# Patient Record
Sex: Male | Born: 1990 | Race: Black or African American | Hispanic: No | Marital: Single | Smoking: Never smoker
Health system: Southern US, Community
[De-identification: ages and names within clinical notes are randomized; demographics above are authoritative.]

## PROBLEM LIST (undated history)

## (undated) DIAGNOSIS — F909 Attention-deficit hyperactivity disorder, unspecified type: Secondary | ICD-10-CM

## (undated) HISTORY — PX: TONSILLECTOMY AND ADENOIDECTOMY: SUR1326

## (undated) HISTORY — DX: Attention-deficit hyperactivity disorder, unspecified type: F90.9

## (undated) HISTORY — PX: OTHER SURGICAL HISTORY: SHX169

---

## 2013-12-31 ENCOUNTER — Ambulatory Visit: Payer: BC Managed Care – PPO

## 2013-12-31 ENCOUNTER — Ambulatory Visit (INDEPENDENT_AMBULATORY_CARE_PROVIDER_SITE_OTHER): Payer: BC Managed Care – PPO | Admitting: Family Medicine

## 2013-12-31 VITALS — BP 100/68 | HR 85 | Temp 98.3°F | Resp 16 | Ht 66.0 in | Wt 165.0 lb

## 2013-12-31 DIAGNOSIS — M25571 Pain in right ankle and joints of right foot: Secondary | ICD-10-CM

## 2013-12-31 DIAGNOSIS — T148XXA Other injury of unspecified body region, initial encounter: Secondary | ICD-10-CM

## 2013-12-31 DIAGNOSIS — M79673 Pain in unspecified foot: Secondary | ICD-10-CM

## 2013-12-31 DIAGNOSIS — M79609 Pain in unspecified limb: Secondary | ICD-10-CM

## 2013-12-31 DIAGNOSIS — M25579 Pain in unspecified ankle and joints of unspecified foot: Secondary | ICD-10-CM

## 2013-12-31 MED ORDER — HYDROCODONE-ACETAMINOPHEN 5-325 MG PO TABS
1.0000 | ORAL_TABLET | Freq: Four times a day (QID) | ORAL | Status: AC | PRN
Start: 2013-12-31 — End: ?

## 2013-12-31 NOTE — Progress Notes (Signed)
Chief Complaint:  Chief Complaint  Patient presents with  . Ankle Injury    right ankle, twisted during baseball game    HPI: Jermaine Dalton is a 23 y.o. male who is here for  Right heel pain and some ankle pain started around 2 pm this afternoon after colliding with another player during baseball, It is a 1/0/10 sharp pain , he has pain constant throbbing 10/10 painven  ewhen something touches it . Has tried ice ad nonweightbearing  Past Medical History  Diagnosis Date  . ADHD (attention deficit hyperactivity disorder)    Past Surgical History  Procedure Laterality Date  . Cheek bone surgery      age 89 during baseball   History   Social History  . Marital Status: Single    Spouse Name: N/A    Number of Children: N/A  . Years of Education: N/A   Social History Main Topics  . Smoking status: Never Smoker   . Smokeless tobacco: Current User    Types: Chew  . Alcohol Use: None  . Drug Use: None  . Sexual Activity: None   Other Topics Concern  . None   Social History Narrative  . None   No family history on file. No Known Allergies Prior to Admission medications   Medication Sig Start Date End Date Taking? Authorizing Provider  amphetamine-dextroamphetamine (ADDERALL XR) 20 MG 24 hr capsule Take 20 mg by mouth daily.   Yes Historical Provider, MD     ROS: The patient denies fevers, chills, night sweats, unintentional weight loss, chest pain, palpitations, wheezing, dyspnea on exertion, nausea, vomiting, abdominal pain, dysuria, hematuria, melena, numbness, weakness, or tingling.   All other systems have been reviewed and were otherwise negative with the exception of those mentioned in the HPI and as above.    PHYSICAL EXAM: Filed Vitals:   12/31/13 1454  BP: 100/68  Pulse: 85  Temp: 98.3 F (36.8 C)  Resp: 16   Filed Vitals:   12/31/13 1454  Height: 5\' 6"  (1.676 m)  Weight: 165 lb (74.844 kg)   Body mass index is 26.64 kg/(m^2).  General:  Alert, no acute distress HEENT:  Normocephalic, atraumatic, oropharynx patent. EOMI, PERRLA Cardiovascular:  Regular rate and rhythm, no rubs murmurs or gallops.  No Carotid bruits, radial pulse intact. No pedal edema.  Respiratory: Clear to auscultation bilaterally.  No wheezes, rales, or rhonchi.  No cyanosis, no use of accessory musculature GI: No organomegaly, abdomen is soft and non-tender, positive bowel sounds.  No masses. Skin: No rashes. Neurologic: Facial musculature symmetric. Psychiatric: Patient is appropriate throughout our interaction. Lymphatic: No cervical lymphadenopathy Musculoskeletal: Gait intact. 5/5 strength, min swelling, no bruises, tender to minimal palpation Decrease ROM due to pain + cap refill, + DP and PTA   LABS: No results found for this or any previous visit.   EKG/XRAY:   [facility]. No obvious fractures/dislocation + soft tissue swelling   ASSESSMENT/PLAN: Encounter Diagnoses  Name Primary?  . Right ankle pain Yes  . Heel pain   . Sprain and strain    Rx Norco for pain prn RICE Crutches Nonweightbearing Cam boot given  F/u with sports medicine doctor form team in 1 week   Gross sideeffects, risk and benefits, and alternatives of medications d/w patient. Patient is aware that all medications have potential sideeffects and we are unable to predict every sideeffect or drug-drug interaction that may occur.  Aashka Salomone  P Amyla Heffner, DO 12/31/2013 4:05 PM

## 2015-02-08 IMAGING — CR DG ANKLE COMPLETE 3+V*R*
4 series · 4 of 4 positions shown · non-contrast
Comparison: None.

CLINICAL DATA: Heel pain.  Trauma.

EXAM:
RIGHT ANKLE - COMPLETE 3+ VIEW; RIGHT OS CALCIS - 2+ VIEW

[AP]
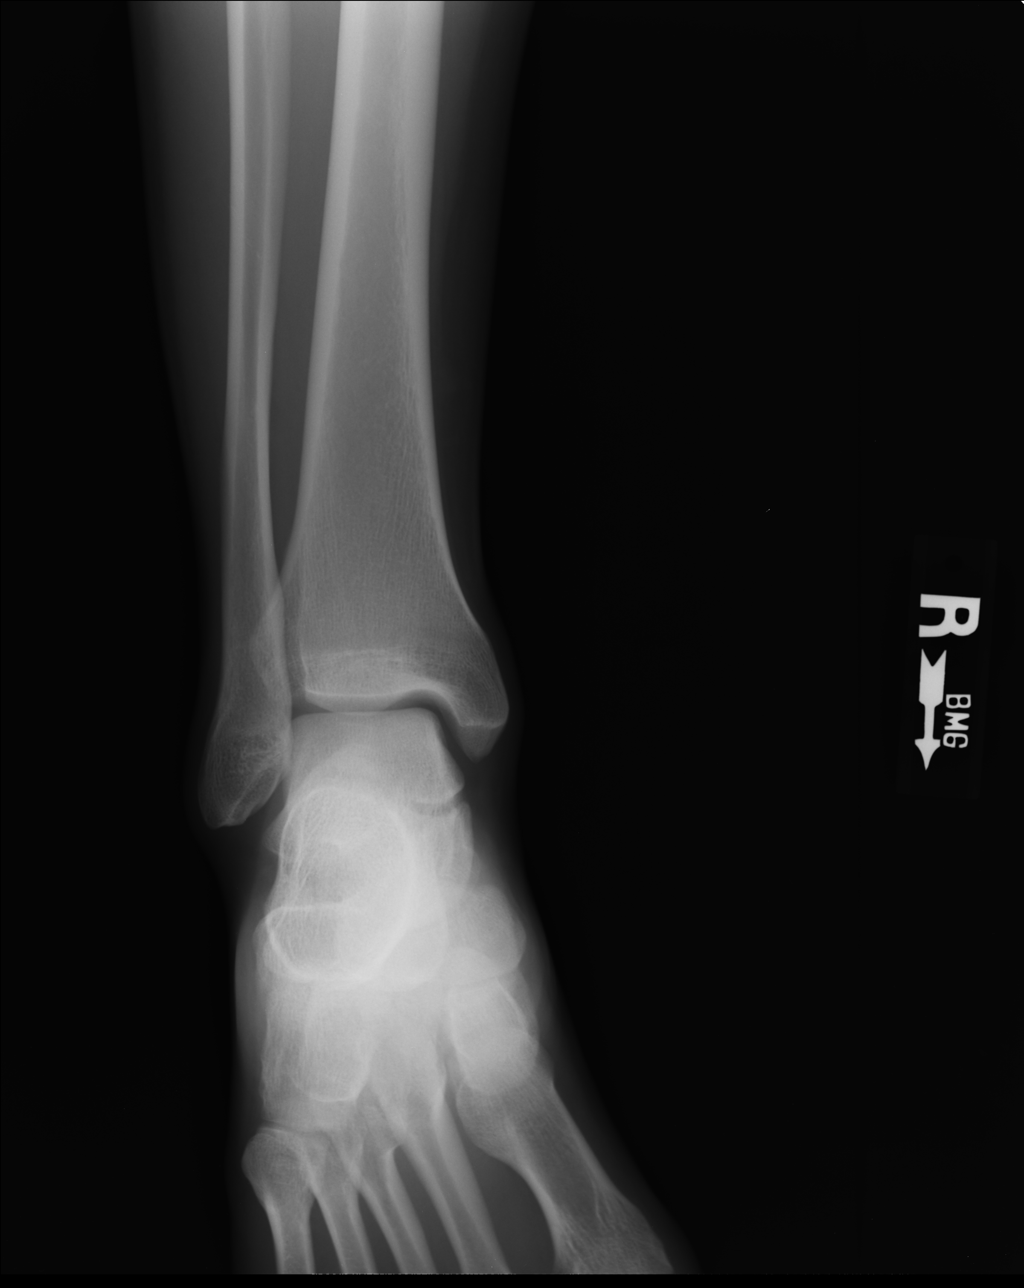

[ap obl int rot]
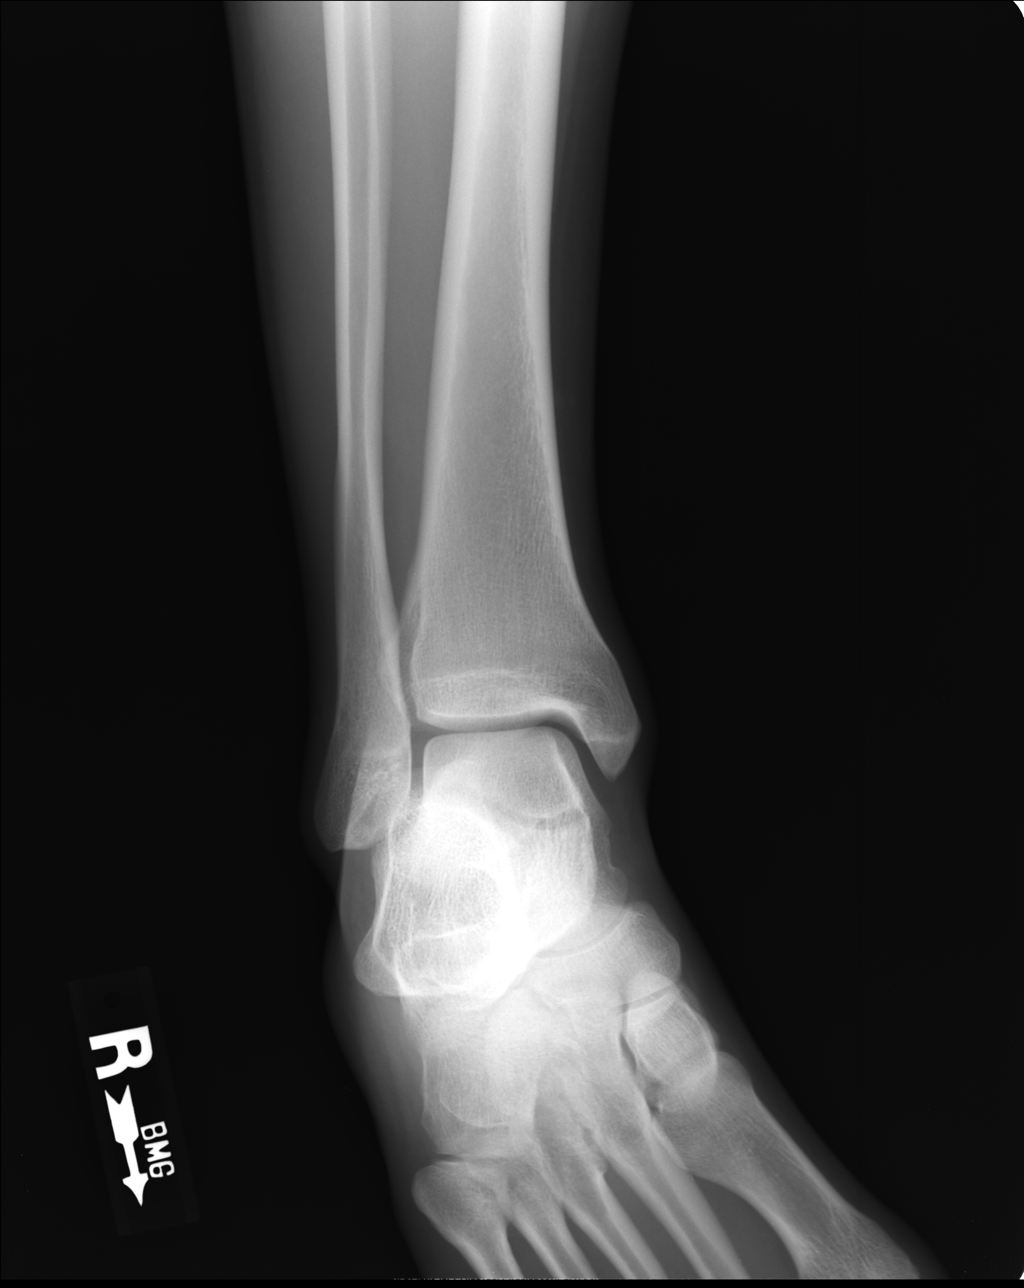

[medial obl]
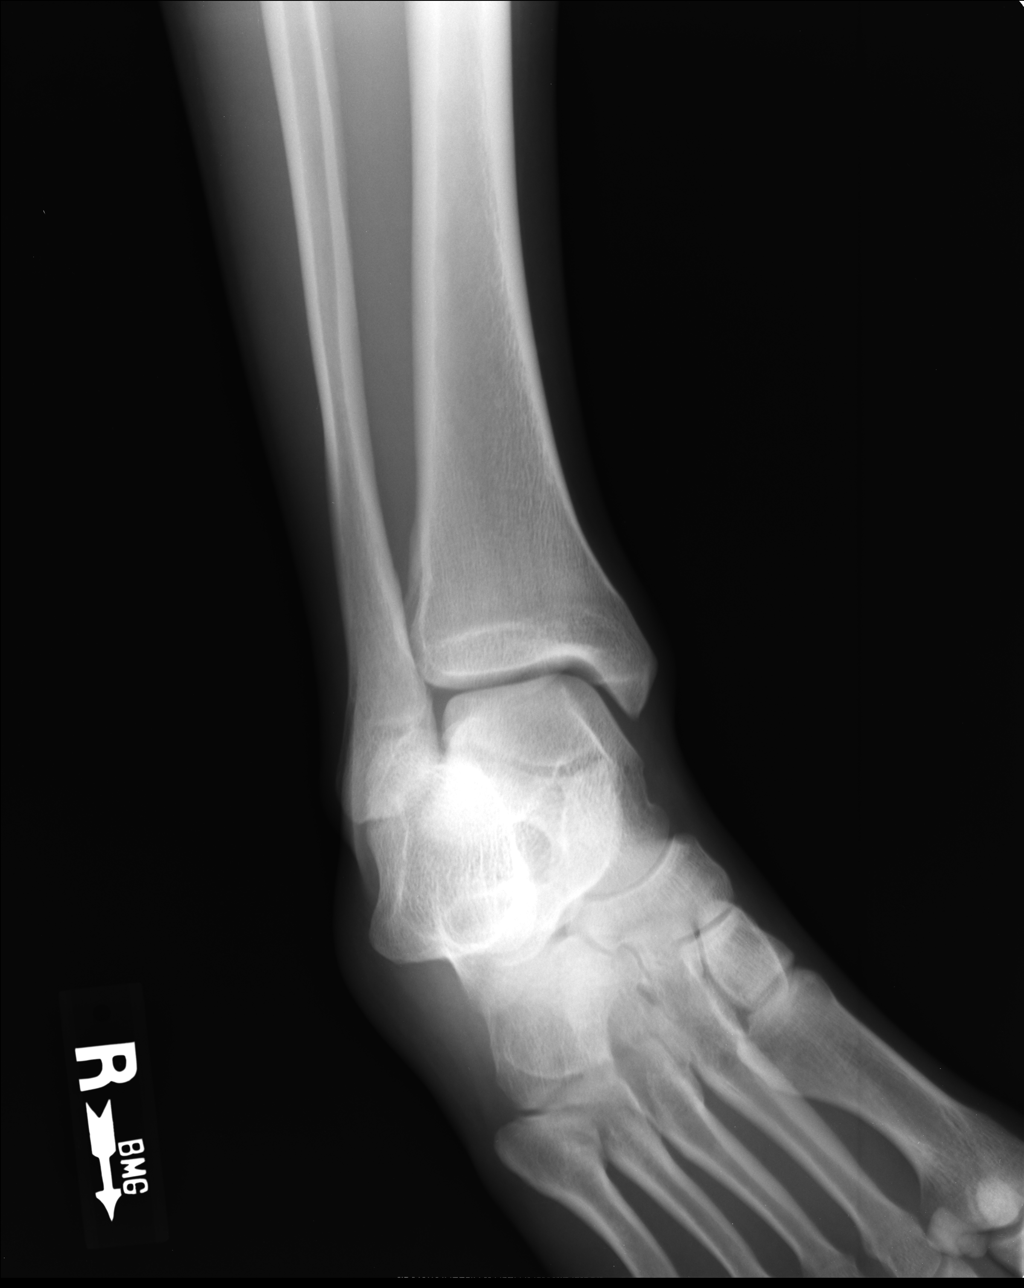

[lateral]
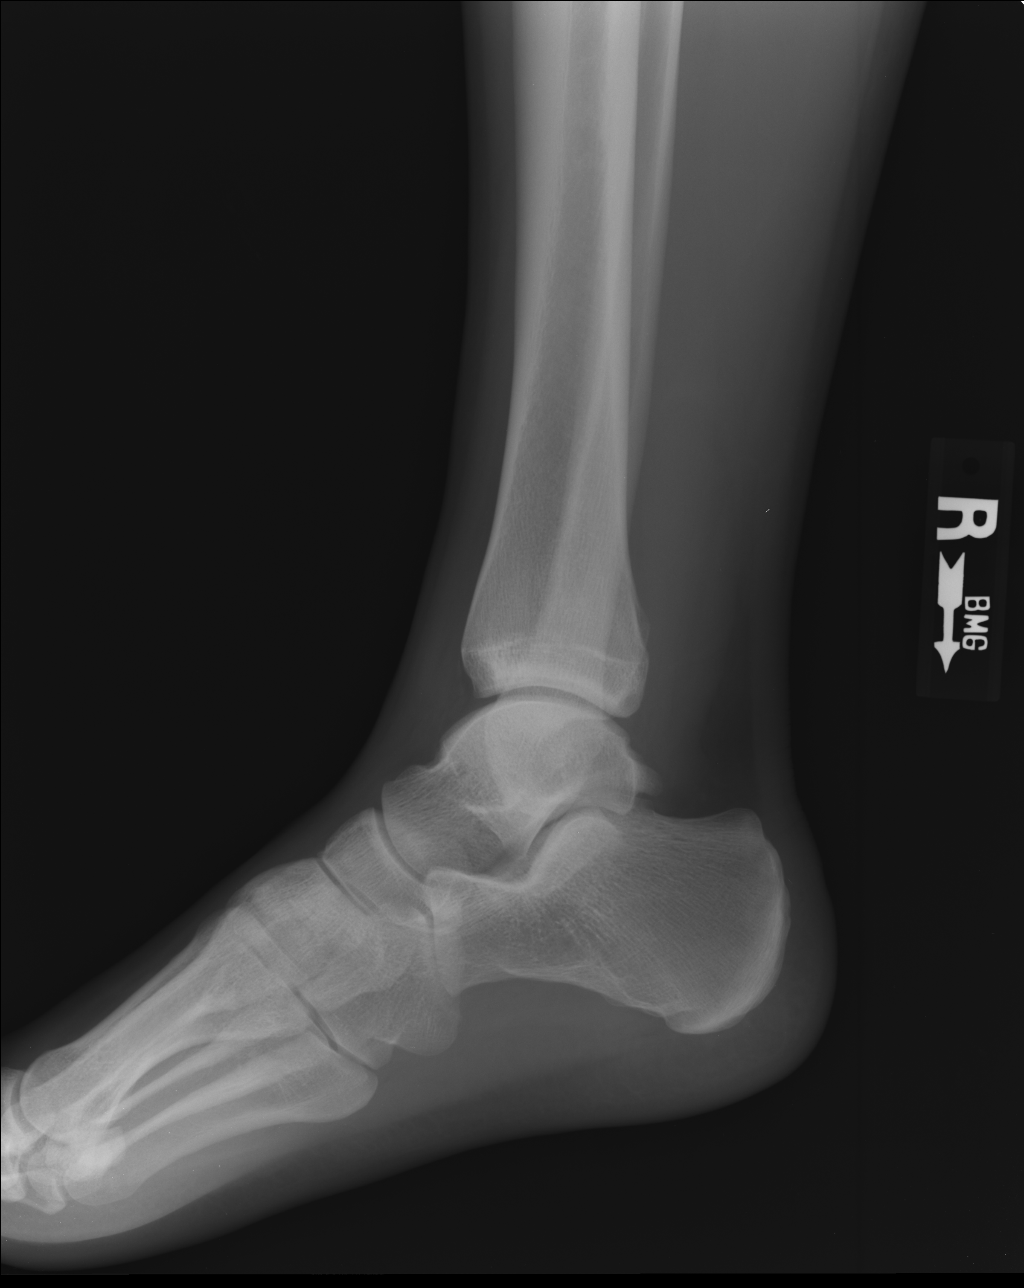

[4 of 4 positions shown; findings below may reference images not displayed]

FINDINGS: AP and lateral views of the calcaneus demonstrate no fracture or
dislocation.

Four views of the right ankle demonstrate no fracture or
dislocation. Base of fifth metatarsal and talar dome intact.
IMPRESSION: No acute osseous abnormality.
